# Patient Record
Sex: Female | Born: 2000 | Race: White | Hispanic: No | Marital: Single | Smoking: Never smoker
Health system: Southern US, Community
[De-identification: ages and names within clinical notes are randomized; demographics above are authoritative.]

## PROBLEM LIST (undated history)

## (undated) DIAGNOSIS — J302 Other seasonal allergic rhinitis: Secondary | ICD-10-CM

---
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
## 2019-01-11 ENCOUNTER — Other Ambulatory Visit: Payer: Self-pay

## 2019-01-11 ENCOUNTER — Ambulatory Visit (HOSPITAL_COMMUNITY)
Admission: EM | Admit: 2019-01-11 | Discharge: 2019-01-11 | Disposition: A | Discharge status: Home / Self Care | Payer: Federal, State, Local not specified - PPO | Attending: Family Medicine | Admitting: Family Medicine

## 2019-01-11 ENCOUNTER — Encounter (HOSPITAL_COMMUNITY): Payer: Self-pay

## 2019-01-11 DIAGNOSIS — R112 Nausea with vomiting, unspecified: Secondary | ICD-10-CM

## 2019-01-11 MED ORDER — ONDANSETRON 4 MG PO TBDP
ORAL_TABLET | ORAL | Status: AC
  Filled 2019-01-11: qty 1

## 2019-01-11 MED ORDER — ONDANSETRON 4 MG PO TBDP
4.0000 mg | ORAL_TABLET | Freq: Once | ORAL | Status: AC
  Administered 2019-01-11: 14:00:00 4 mg via ORAL

## 2019-01-11 MED ORDER — ONDANSETRON HCL 4 MG PO TABS: 4.0000 mg | ORAL_TABLET | Freq: Four times a day (QID) | ORAL | 0 refills | Status: AC

## 2019-01-11 NOTE — ED Triage Notes (Signed)
Patient presents to Urgent Care with complaints of abdominal cramping since 5 days ago after she had an IUD placed. Patient reports she drank alcohol last night and vomited once, had a couple more episodes of vomiting this morning and thinks she is now dehydrated.

## 2019-01-11 NOTE — ED Provider Notes (Signed)
[facility]    CSN: [id number] Arrival date & time: 01/11/19  1325      History   Chief Complaint Chief Complaint  Patient presents with  . Emesis    HPI Hannah Dudley is a 18 y.o. female.   HPI  Patient states that she still has been taking a lot of ibuprofen and Aleve because she was having cramping from an IUD insertion earlier in the week.  She had some alcohol to drink last night.  Towards the end of the evening she threw up.  Today she is thrown up several times.  She is here because she was starting to feel dehydrated.  She was unable to keep down even water. Since arriving at the unit she has been given Zofran, and is now keeping down some sips of water.  She states she feels somewhat better.  Still feels "weak".  No fever or chills.  No loss of taste or smell.  No body aches.  No known exposure to coronavirus or illness.  Past Medical History:  Diagnosis Date  . Seasonal allergies     Patient Active Problem List   Diagnosis Date Noted  . Closed Salter-Harris type III fracture of lower end of left tibia 03/09/2013  . 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 Rhomboid muscle strain 11/24/2012  . Tinea versicolor 09/19/2012  . Nondisplaced  nondepressed fracture of the lateral tibial plateau, right  08/15/2012  . Low back pain 03/18/2012    History reviewed. No pertinent surgical history.  OB History   No obstetric history on file.      Home Medications    Prior to Admission medications   Medication Sig Start Date End Date Taking? Authorizing Provider  AMBULATORY NON FORMULARY MEDICATION Lace up Ankle stabilizing orthosis. Apply to ankle daily. 04/23/13   Silverio Decamp, MD  ondansetron (ZOFRAN) 4 MG tablet Take 1 tablet (4 mg total) by mouth every 6 (six) hours. 01/11/19   Raylene Everts, MD    Family History Family History  Family history unknown: Yes    Social History Social History   Tobacco Use  .  Smoking status: Never Smoker  . Smokeless tobacco: Never Used  Substance Use Topics  . Alcohol use: Yes  . Drug use: No     Allergies   Patient has no known allergies.   Review of Systems Review of Systems  Constitutional: Negative for chills and fever.  HENT: Negative for ear pain and sore throat.   Eyes: Negative for pain and visual disturbance.  Respiratory: Negative for cough and shortness of breath.   Cardiovascular: Negative for chest pain and palpitations.  Gastrointestinal: Positive for nausea and vomiting. Negative for abdominal pain and diarrhea.  Genitourinary: Negative for dysuria and hematuria.  Musculoskeletal: Negative for arthralgias, back pain and myalgias.  Skin: Negative for color change and rash.  Neurological: Negative for seizures, syncope and headaches.  All other systems reviewed and are negative.    Physical Exam Triage Vital Signs ED Triage Vitals  Enc Vitals Group     BP 01/11/19 1401 129/75     Pulse Rate 01/11/19 1401 71     Resp 01/11/19 1401 15     Temp 01/11/19 1401 98.3 F (36.8 C)     Temp Source 01/11/19 1401 Oral     SpO2 01/11/19 1401 100 %     Weight --      Height --      Head  Circumference --      Peak Flow --      Pain Score 01/11/19 1359 4     Pain Loc --      Pain Edu? --      Excl. in GC? --    No data found.  Updated Vital Signs BP 129/75 (BP Location: Left Arm)   Pulse 71   Temp 98.3 F (36.8 C) (Oral)   Resp 15   SpO2 100%       Physical Exam Constitutional:      General: She is not in acute distress.    Appearance: She is well-developed.  HENT:     Head: Normocephalic and atraumatic.  Eyes:     Conjunctiva/sclera: Conjunctivae normal.     Pupils: Pupils are equal, round, and reactive to light.  Neck:     Musculoskeletal: Normal range of motion.  Cardiovascular:     Rate and Rhythm: Normal rate.  Pulmonary:     Effort: Pulmonary effort is normal. No respiratory distress.  Abdominal:     General:  Abdomen is flat. Bowel sounds are normal. There is no distension.     Palpations: Abdomen is soft.     Tenderness: There is no abdominal tenderness.  Musculoskeletal: Normal range of motion.  Skin:    General: Skin is warm and dry.  Neurological:     Mental Status: She is alert.  Psychiatric:        Mood and Affect: Mood normal.        Behavior: Behavior normal.      UC Treatments / Results  Labs (all labs ordered are listed, but only abnormal results are displayed) Labs Reviewed - No data to display  EKG   Radiology No results found.  Procedures Procedures (including critical care time)  Medications Ordered in UC Medications  ondansetron (ZOFRAN-ODT) disintegrating tablet 4 mg (4 mg Oral Given 01/11/19 1407)  ondansetron (ZOFRAN-ODT) 4 MG disintegrating tablet (has no administration in time range)    Initial Impression / Assessment and Plan / UC Course  I have reviewed the triage vital signs and the nursing notes.  Pertinent labs & imaging results that were available during my care of the patient were reviewed by me and considered in my medical decision making (see chart for details).     Vital signs are stable.  Patient is taking p.o. fluids.  We will send her home with a prescription for Zofran and instructions on rehydration and vomiting Final Clinical Impressions(s) / UC Diagnoses   Final diagnoses:  Intractable vomiting with nausea, unspecified vomiting type     Discharge Instructions     Be sure to drink plenty of fluids. As soon as you are able, advance to bland diet Take Zofran as needed for nausea and vomiting    ED Prescriptions    Medication Sig Dispense Auth. Provider   ondansetron (ZOFRAN) 4 MG tablet Take 1 tablet (4 mg total) by mouth every 6 (six) hours. 12 tablet Eustace Moore, MD     PDMP not reviewed this encounter.   Eustace Moore, MD 01/11/19 [phone]

## 2019-01-11 NOTE — Discharge Instructions (Signed)
Be sure to drink plenty of fluids. As soon as you are able, advance to bland diet Take Zofran as needed for nausea and vomiting

## 2019-08-23 ENCOUNTER — Emergency Department (HOSPITAL_COMMUNITY)
Admission: EM | Admit: 2019-08-23 | Discharge: 2019-08-23 | Discharge status: Against Medical Advice | Payer: Federal, State, Local not specified - PPO

## 2019-08-23 NOTE — ED Notes (Addendum)
Pt stated that she did not want to be seen. Her friends and her sister brought her here and she does not know why. Pt stated "I have had a few drinks today, however I am not drunk. I take my anxiety medicine. I just want to home. I don't feel like I need to be seen by a doctor." Pt decided to leave. Pt ambulated gait steady with no assistance. Pt was talking clearly and was able to answer all questions the triage nurse asked.
